# Patient Record
Sex: Female | Born: 2000 | Race: Black or African American | Hispanic: No | Marital: Single | Smoking: Current every day smoker
Health system: Southern US, Community
[De-identification: ages and names within clinical notes are randomized; demographics above are authoritative.]

## PROBLEM LIST (undated history)

## (undated) DIAGNOSIS — J45909 Unspecified asthma, uncomplicated: Secondary | ICD-10-CM

## (undated) HISTORY — PX: NO PAST SURGERIES: SHX2092

---
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
## 2021-06-24 ENCOUNTER — Encounter: Payer: Self-pay | Admitting: Emergency Medicine

## 2021-06-24 ENCOUNTER — Other Ambulatory Visit: Payer: Self-pay

## 2021-06-24 ENCOUNTER — Emergency Department
Admission: EM | Admit: 2021-06-24 | Discharge: 2021-06-24 | Disposition: A | Discharge status: Home / Self Care | Payer: Medicaid Other | Attending: Emergency Medicine | Admitting: Emergency Medicine

## 2021-06-24 DIAGNOSIS — J45909 Unspecified asthma, uncomplicated: Secondary | ICD-10-CM | POA: Insufficient documentation

## 2021-06-24 DIAGNOSIS — R300 Dysuria: Secondary | ICD-10-CM | POA: Diagnosis present

## 2021-06-24 DIAGNOSIS — N3001 Acute cystitis with hematuria: Secondary | ICD-10-CM | POA: Insufficient documentation

## 2021-06-24 LAB — WET PREP, GENITAL
Clue Cells Wet Prep HPF POC: NONE SEEN
Sperm: NONE SEEN
Trich, Wet Prep: NONE SEEN
Yeast Wet Prep HPF POC: NONE SEEN

## 2021-06-24 LAB — URINALYSIS, COMPLETE (UACMP) WITH MICROSCOPIC
Bacteria, UA: NONE SEEN
Bilirubin Urine: NEGATIVE
Glucose, UA: NEGATIVE mg/dL
Ketones, ur: NEGATIVE mg/dL
Nitrite: NEGATIVE
Protein, ur: 100 mg/dL — AB
RBC / HPF: >50 RBC/hpf — ABNORMAL HIGH (ref 0–5)
Specific Gravity, Urine: 1.021 (ref 1.005–1.030)
WBC, UA: >50 WBC/hpf — ABNORMAL HIGH (ref 0–5)
pH: 6 (ref 5.0–8.0)

## 2021-06-24 LAB — CHLAMYDIA/NGC RT PCR (ARMC ONLY)
Chlamydia Tr: DETECTED — AB
N gonorrhoeae: NOT DETECTED

## 2021-06-24 LAB — POC URINE PREG, ED: Preg Test, Ur: NEGATIVE

## 2021-06-24 MED ORDER — DOXYCYCLINE MONOHYDRATE 100 MG PO TABS: 100.0000 mg | ORAL_TABLET | Freq: Two times a day (BID) | ORAL | 0 refills | Status: DC

## 2021-06-24 MED ORDER — DOXYCYCLINE MONOHYDRATE 100 MG PO TABS: 100.0000 mg | ORAL_TABLET | Freq: Two times a day (BID) | ORAL | 0 refills | Status: AC

## 2021-06-24 MED ORDER — CEFTRIAXONE SODIUM 1 G IJ SOLR
1.0000 g | Freq: Once | INTRAMUSCULAR | Status: AC
  Administered 2021-06-24: 1 g via INTRAMUSCULAR
  Filled 2021-06-24: qty 10

## 2021-06-24 MED ORDER — METRONIDAZOLE 500 MG PO TABS: 500.0000 mg | ORAL_TABLET | Freq: Two times a day (BID) | ORAL | 0 refills | Status: AC

## 2021-06-24 MED ORDER — CEPHALEXIN 500 MG PO CAPS: 500.0000 mg | ORAL_CAPSULE | Freq: Four times a day (QID) | ORAL | 0 refills | Status: AC

## 2021-06-24 NOTE — Discharge Instructions (Addendum)
Please take Keflex 4 times daily for the next 7 days.

## 2021-06-24 NOTE — ED Provider Notes (Signed)
[facility]-EMERGENCY DEPARTMENT  ____________________________________________  Time seen: Approximately 6:26 PM  I have reviewed the triage vital signs and the nursing notes.   HISTORY  Chief Complaint Dysuria   Historian Patient     HPI Erica Finley is a 20 y.o. female presents to the emergency department with dysuria, suprapubic pain and low back pain for 1 week.  Patient denies fever and chills at home but does state that symptoms seem to be progressively worsening.  Patient has had unprotected sex and reports that STDs are a possibility.  No vaginal itching or changes in vaginal odor.  No dyspareunia.  No similar symptoms in the past.   Past Medical History:  Diagnosis Date   Asthma      Immunizations up to date:  Yes.     Past Medical History:  Diagnosis Date   Asthma     Patient Active Problem List   Diagnosis Date Noted   Overweight BMI=37.6 01/25/2020   Obesity, 233 lbs 08/30/2018    History reviewed. No pertinent surgical history.  Prior to Admission medications   Medication Sig Start Date End Date Taking? Authorizing Provider  cephALEXin (KEFLEX) 500 MG capsule Take 1 capsule (500 mg total) by mouth 4 (four) times daily for 7 days. 06/24/21 07/01/21 Yes Pia Mau M, PA-C  triamcinolone cream (KENALOG) 0.1 % Apply 1 application topically 2 (two) times daily. Patient not taking: Reported on 07/27/2019 06/15/16   Tommi Rumps, PA-C    Allergies Penicillins  Family History  Problem Relation Age of Onset   Hypertension Father    Diabetes Father    Heart failure Father    Ovarian cancer Mother    Ovarian cancer Maternal Grandmother    Sickle cell trait Half-Brother     Social History Social History   Tobacco Use   Smoking status: Never   Smokeless tobacco: Never  Substance Use Topics   Alcohol use: No   Drug use: No     Review of Systems  Constitutional: No fever/chills Eyes:  No discharge ENT: No upper respiratory  complaints. Respiratory: no cough. No SOB/ use of accessory muscles to breath Gastrointestinal:   No nausea, no vomiting.  No diarrhea.  No constipation. Genitourinary: Patient has dysuria.  Musculoskeletal: Negative for musculoskeletal pain. Skin: Negative for rash, abrasions, lacerations, ecchymosis.  ____________________________________________   PHYSICAL EXAM:  VITAL SIGNS: ED Triage Vitals  Enc Vitals Group     BP 06/24/21 1730 132/90     Pulse Rate 06/24/21 1730 75     Resp 06/24/21 1730 16     Temp 06/24/21 1730 98.9 F (37.2 C)     Temp Source 06/24/21 1730 Oral     SpO2 06/24/21 1730 99 %     Weight 06/24/21 1731 225 lb 15.5 oz (102.5 kg)     Height 06/24/21 1731 5\' 5"  (1.651 m)     Head Circumference --      Peak Flow --      Pain Score 06/24/21 1731 0     Pain Loc --      Pain Edu? --      Excl. in GC? --      Constitutional: Alert and oriented. Well appearing and in no acute distress. Eyes: Conjunctivae are normal. PERRL. EOMI. Head: Atraumatic. ENT:      Nose: No congestion/rhinnorhea.      Mouth/Throat: Mucous membranes are moist.  Neck: No stridor.  No cervical spine tenderness to palpation. Cardiovascular: Normal rate, regular rhythm.  Normal S1 and S2.  Good peripheral circulation. Respiratory: Normal respiratory effort without tachypnea or retractions. Lungs CTAB. Good air entry to the bases with no decreased or absent breath sounds Gastrointestinal: Bowel sounds x 4 quadrants. Soft and nontender to palpation. No guarding or rigidity. No distention. Genitourinary:  Musculoskeletal: Full range of motion to all extremities. No obvious deformities noted Neurologic:  Normal for age. No gross focal neurologic deficits are appreciated.  Skin:  Skin is warm, dry and intact. No rash noted. Psychiatric: Mood and affect are normal for age. Speech and behavior are normal.   ____________________________________________   LABS (all labs ordered are listed, but  only abnormal results are displayed)  Labs Reviewed  WET PREP, GENITAL - Abnormal; Notable for the following components:      Result Value   WBC, Wet Prep HPF POC RARE (*)    All other components within normal limits  URINALYSIS, COMPLETE (UACMP) WITH MICROSCOPIC - Abnormal; Notable for the following components:   Color, Urine YELLOW (*)    APPearance CLOUDY (*)    Hgb urine dipstick LARGE (*)    Protein, ur 100 (*)    Leukocytes,Ua MODERATE (*)    RBC / HPF >50 (*)    WBC, UA >50 (*)    All other components within normal limits  CHLAMYDIA/NGC RT PCR ([facility] ONLY)            URINE CULTURE  POC URINE PREG, ED   ____________________________________________  EKG   ____________________________________________  RADIOLOGY Geraldo Pitter, personally viewed and evaluated these images (plain radiographs) as part of my medical decision making, as well as reviewing the written report by the radiologist.    No results found.  ____________________________________________    PROCEDURES  Procedure(s) performed:     Procedures     Medications  cefTRIAXone (ROCEPHIN) injection 1 g (has no administration in time range)     ____________________________________________   INITIAL IMPRESSION / ASSESSMENT AND PLAN / ED COURSE  Pertinent labs & imaging results that were available during my care of the patient were reviewed by me and considered in my medical decision making (see chart for details).      Assessment and Plan:  Urinary Tract Infection: Chlamydia 20 year old female presents to the emergency department with dysuria, low back pain and suprapubic pain for 1 week.  No nausea, vomiting or abdominal pain.  Vital signs are reassuring at triage.  On physical exam, patient was alert, active and nontoxic-appearing.  Abdomen was soft and nontender without guarding.  GU exam indicated no cervical motion tenderness.  Urinalysis was concerning for UTI with large amount  hemoglobin and greater than 50 white blood cells.  Urine culture is in process.  Wet prep noncontributory pretrip, BV or yeast.  Will give IM Rocephin and discharged with Keflex. If Gonorrhea and Chlamydia testing returns positive, will treat patient with doxycycline.  Chlamydia testing was positive.  Will cover patient for PID with doxycycline twice daily for 14 days and Flagyl twice daily for 14 days.  Patient received IM Rocephin which will cover patient for gonorrhea and pyelonephritis.  Advised patient to continue Keflex 4 times daily for the next 7 days.    ____________________________________________  FINAL CLINICAL IMPRESSION(S) / ED DIAGNOSES  Final diagnoses:  Acute cystitis with hematuria      NEW MEDICATIONS STARTED DURING THIS VISIT:  ED Discharge Orders          Ordered    cephALEXin (KEFLEX) 500 MG capsule  4 times daily        06/24/21 1933                This chart was dictated using voice recognition software/Dragon. Despite best efforts to proofread, errors can occur which can change the meaning. Any change was purely unintentional.     Orvil Feil, PA-C 06/24/21 2101    Delton [place], MD 06/25/21 [phone]

## 2021-06-24 NOTE — ED Notes (Signed)
See triage note  Presents with some lower abd discomfort and pain with urination  No fever

## 2021-06-24 NOTE — ED Triage Notes (Signed)
C/O dysuria, urgency, frequency, burning.  Pelvic cramping after void.  Symptoms x 1 week.

## 2021-06-27 LAB — URINE CULTURE: Culture: 10000 — AB

## 2021-08-12 ENCOUNTER — Emergency Department
Admission: EM | Admit: 2021-08-12 | Discharge: 2021-08-12 | Disposition: A | Discharge status: Home / Self Care | Payer: Medicaid Other | Attending: Student in an Organized Health Care Education/Training Program | Admitting: Student in an Organized Health Care Education/Training Program

## 2021-08-12 ENCOUNTER — Emergency Department: Payer: Medicaid Other

## 2021-08-12 ENCOUNTER — Other Ambulatory Visit: Payer: Self-pay

## 2021-08-12 DIAGNOSIS — R102 Pelvic and perineal pain: Secondary | ICD-10-CM | POA: Diagnosis not present

## 2021-08-12 DIAGNOSIS — R1030 Lower abdominal pain, unspecified: Secondary | ICD-10-CM

## 2021-08-12 DIAGNOSIS — R1032 Left lower quadrant pain: Secondary | ICD-10-CM | POA: Insufficient documentation

## 2021-08-12 DIAGNOSIS — R1031 Right lower quadrant pain: Secondary | ICD-10-CM | POA: Diagnosis present

## 2021-08-12 DIAGNOSIS — M545 Low back pain, unspecified: Secondary | ICD-10-CM | POA: Insufficient documentation

## 2021-08-12 DIAGNOSIS — J45909 Unspecified asthma, uncomplicated: Secondary | ICD-10-CM | POA: Insufficient documentation

## 2021-08-12 LAB — URINALYSIS, COMPLETE (UACMP) WITH MICROSCOPIC
Bacteria, UA: NONE SEEN
Glucose, UA: NEGATIVE mg/dL
Hgb urine dipstick: NEGATIVE
Leukocytes,Ua: NEGATIVE
Nitrite: NEGATIVE
Protein, ur: 300 mg/dL — AB
Specific Gravity, Urine: 1.03 (ref 1.005–1.030)
pH: 6 (ref 5.0–8.0)

## 2021-08-12 LAB — WET PREP, GENITAL
Sperm: NONE SEEN
Trich, Wet Prep: NONE SEEN
Yeast Wet Prep HPF POC: NONE SEEN

## 2021-08-12 LAB — CHLAMYDIA/NGC RT PCR (ARMC ONLY)
Chlamydia Tr: NOT DETECTED
Chlamydia Tr: NOT DETECTED
N gonorrhoeae: NOT DETECTED
N gonorrhoeae: NOT DETECTED

## 2021-08-12 LAB — COMPREHENSIVE METABOLIC PANEL
ALT: 14 U/L (ref 0–44)
AST: 15 U/L (ref 15–41)
Albumin: 3.8 g/dL (ref 3.5–5.0)
Alkaline Phosphatase: 56 U/L (ref 38–126)
Anion gap: 6 (ref 5–15)
BUN: 9 mg/dL (ref 6–20)
CO2: 24 mmol/L (ref 22–32)
Calcium: 8.8 mg/dL — ABNORMAL LOW (ref 8.9–10.3)
Chloride: 107 mmol/L (ref 98–111)
Creatinine, Ser: 0.76 mg/dL (ref 0.44–1.00)
GFR, Estimated: >60 mL/min (ref >60)
Glucose, Bld: 108 mg/dL — ABNORMAL HIGH (ref 70–99)
Potassium: 3.3 mmol/L — ABNORMAL LOW (ref 3.5–5.1)
Sodium: 137 mmol/L (ref 135–145)
Total Bilirubin: 0.7 mg/dL (ref 0.3–1.2)
Total Protein: 7 g/dL (ref 6.5–8.1)

## 2021-08-12 LAB — CBC
HCT: 37.7 % (ref 36.0–46.0)
Hemoglobin: 12.8 g/dL (ref 12.0–15.0)
MCH: 27.7 pg (ref 26.0–34.0)
MCHC: 34 g/dL (ref 30.0–36.0)
MCV: 81.6 fL (ref 80.0–100.0)
Platelets: 225 10*3/uL (ref 150–400)
RBC: 4.62 MIL/uL (ref 3.87–5.11)
RDW: 12.6 % (ref 11.5–15.5)
WBC: 5.4 10*3/uL (ref 4.0–10.5)
nRBC: 0 % (ref 0.0–0.2)

## 2021-08-12 LAB — HCG, QUANTITATIVE, PREGNANCY: hCG, Beta Chain, Quant, S: <1 m[IU]/mL (ref <5)

## 2021-08-12 LAB — LIPASE, BLOOD: Lipase: 29 U/L (ref 11–51)

## 2021-08-12 MED ORDER — METRONIDAZOLE 500 MG PO TABS: 500.0000 mg | ORAL_TABLET | Freq: Two times a day (BID) | ORAL | 0 refills | Status: DC

## 2021-08-12 MED ORDER — METRONIDAZOLE 500 MG PO TABS
500.0000 mg | ORAL_TABLET | Freq: Once | ORAL | Status: AC
  Administered 2021-08-12: 500 mg via ORAL
  Filled 2021-08-12: qty 1

## 2021-08-12 MED ORDER — DOXYCYCLINE HYCLATE 100 MG PO TABS
100.0000 mg | ORAL_TABLET | Freq: Once | ORAL | Status: DC
Start: 2021-08-12 — End: 2021-08-12

## 2021-08-12 MED ORDER — ONDANSETRON 4 MG PO TBDP
4.0000 mg | ORAL_TABLET | Freq: Three times a day (TID) | ORAL | 0 refills | Status: DC | PRN
Start: 2021-08-12 — End: 2022-01-29

## 2021-08-12 MED ORDER — METRONIDAZOLE 500 MG PO TABS: 500.0000 mg | ORAL_TABLET | Freq: Two times a day (BID) | ORAL | 0 refills | Status: AC

## 2021-08-12 MED ORDER — DOXYCYCLINE HYCLATE 100 MG PO TABS
100.0000 mg | ORAL_TABLET | Freq: Once | ORAL | Status: AC
  Administered 2021-08-12: 100 mg via ORAL
  Filled 2021-08-12: qty 1

## 2021-08-12 MED ORDER — DOXYCYCLINE HYCLATE 100 MG PO TABS: 100.0000 mg | ORAL_TABLET | Freq: Two times a day (BID) | ORAL | 0 refills | Status: AC

## 2021-08-12 MED ORDER — CEFTRIAXONE SODIUM 1 G IJ SOLR
500.0000 mg | Freq: Once | INTRAMUSCULAR | Status: AC
  Administered 2021-08-12: 500 mg via INTRAMUSCULAR
  Filled 2021-08-12: qty 10

## 2021-08-12 NOTE — Discharge Instructions (Addendum)

## 2021-08-12 NOTE — ED Triage Notes (Signed)
Pt to ED for lower abd pain that started yesterday. Denies n/v/d States had a UTI a month ago and unsure if this is cause.  NAD noted

## 2021-08-12 NOTE — ED Provider Notes (Signed)
[facility] Emergency Department Provider Note    Event Date/Time   First MD Initiated Contact with Patient 08/12/21 1528     (approximate)  I have reviewed the triage vital signs and the nursing notes.   HISTORY  Chief Complaint Abdominal Pain    HPI Erica Finley is a 20 y.o. female presents to the ER for evaluation of bilateral lower abdominal pain that started yesterday.  Denies any nausea vomiting or diarrhea.  Is never had pain like this before.  No fevers.  Does have some low back pain.  Thinks it may be urinary tract infection but feels different than when she was treated for previously.  Denies any vaginal discharge.  Past Medical History:  Diagnosis Date   Asthma    Family History  Problem Relation Age of Onset   Hypertension Father    Diabetes Father    Heart failure Father    Ovarian cancer Mother    Ovarian cancer Maternal Grandmother    Sickle cell trait Half-Brother    No past surgical history on file. Patient Active Problem List   Diagnosis Date Noted   Overweight BMI=37.6 01/25/2020   Obesity, 233 lbs 08/30/2018      Prior to Admission medications   Medication Sig Start Date End Date Taking? Authorizing Provider  doxycycline (VIBRA-TABS) 100 MG tablet Take 1 tablet (100 mg total) by mouth 2 (two) times daily for 14 days. 08/12/21 08/26/21 Yes Willy Eddy, MD  metroNIDAZOLE (FLAGYL) 500 MG tablet Take 1 tablet (500 mg total) by mouth 2 (two) times daily for 14 days. 08/12/21 08/26/21 Yes Willy Eddy, MD  ondansetron (ZOFRAN ODT) 4 MG disintegrating tablet Take 1 tablet (4 mg total) by mouth every 8 (eight) hours as needed for nausea or vomiting. 08/12/21  Yes Willy Eddy, MD  triamcinolone cream (KENALOG) 0.1 % Apply 1 application topically 2 (two) times daily. Patient not taking: Reported on 07/27/2019 06/15/16   Tommi Rumps, PA-C    Allergies Penicillins    Social History Social History   Tobacco  Use   Smoking status: Never   Smokeless tobacco: Never  Substance Use Topics   Alcohol use: No   Drug use: No    Review of Systems Patient denies headaches, rhinorrhea, blurry vision, numbness, shortness of breath, chest pain, edema, cough, abdominal pain, nausea, vomiting, diarrhea, dysuria, fevers, rashes or hallucinations unless otherwise stated above in HPI. ____________________________________________   PHYSICAL EXAM:  VITAL SIGNS: Vitals:   08/12/21 1449 08/12/21 1540  BP: 140/76 115/65  Pulse: 82 85  Resp: 16 16  Temp: 98.8 F (37.1 C)   SpO2: 100% 100%    Constitutional: Alert and oriented.  Eyes: Conjunctivae are normal.  Head: Atraumatic. Nose: No congestion/rhinnorhea. Mouth/Throat: Mucous membranes are moist.   Neck: No stridor. Painless ROM.  Cardiovascular: Normal rate, regular rhythm. Grossly normal heart sounds.  Good peripheral circulation. Respiratory: Normal respiratory effort.  No retractions. Lungs CTAB. Gastrointestinal: Soft with mild suprapubic ttp, no guarding or rebound,  No distention. No abdominal bruits. No CVA tenderness. Genitourinary:  Musculoskeletal: No lower extremity tenderness nor edema.  No joint effusions. Neurologic:  Normal speech and language. No gross focal neurologic deficits are appreciated. No facial droop Skin:  Skin is warm, dry and intact. No rash noted. Psychiatric: Mood and affect are normal. Speech and behavior are normal.  ____________________________________________   LABS (all labs ordered are listed, but only abnormal results are displayed)  Results for orders placed  or performed during the hospital encounter of 08/12/21 (from the past 24 hour(s))  Urinalysis, Complete w Microscopic     Status: Abnormal   Collection Time: 08/12/21  2:51 PM  Result Value Ref Range   Color, Urine YELLOW (A) YELLOW   APPearance CLOUDY (A) CLEAR   Specific Gravity, Urine 1.030 1.005 - 1.030   pH 6.0 5.0 - 8.0   Glucose, UA  NEGATIVE NEGATIVE mg/dL   Hgb urine dipstick NEGATIVE NEGATIVE   Bilirubin Urine TRACE (A) NEGATIVE   Ketones, ur TRACE (A) NEGATIVE mg/dL   Protein, ur 093 (A) NEGATIVE mg/dL   Nitrite NEGATIVE NEGATIVE   Leukocytes,Ua NEGATIVE NEGATIVE   RBC / HPF 0-5 0 - 5 RBC/hpf   WBC, UA 6-10 0 - 5 WBC/hpf   Bacteria, UA NONE SEEN NONE SEEN   Squamous Epithelial / LPF 21-50 0 - 5   Mucus PRESENT   Lipase, blood     Status: None   Collection Time: 08/12/21  2:52 PM  Result Value Ref Range   Lipase 29 11 - 51 U/L  Comprehensive metabolic panel     Status: Abnormal   Collection Time: 08/12/21  2:52 PM  Result Value Ref Range   Sodium 137 135 - 145 mmol/L   Potassium 3.3 (L) 3.5 - 5.1 mmol/L   Chloride 107 98 - 111 mmol/L   CO2 24 22 - 32 mmol/L   Glucose, Bld 108 (H) 70 - 99 mg/dL   BUN 9 6 - 20 mg/dL   Creatinine, Ser 8.18 0.44 - 1.00 mg/dL   Calcium 8.8 (L) 8.9 - 10.3 mg/dL   Total Protein 7.0 6.5 - 8.1 g/dL   Albumin 3.8 3.5 - 5.0 g/dL   AST 15 15 - 41 U/L   ALT 14 0 - 44 U/L   Alkaline Phosphatase 56 38 - 126 U/L   Total Bilirubin 0.7 0.3 - 1.2 mg/dL   GFR, Estimated >29 >93 mL/min   Anion gap 6 5 - 15  CBC     Status: None   Collection Time: 08/12/21  2:52 PM  Result Value Ref Range   WBC 5.4 4.0 - 10.5 K/uL   RBC 4.62 3.87 - 5.11 MIL/uL   Hemoglobin 12.8 12.0 - 15.0 g/dL   HCT 71.6 96.7 - 89.3 %   MCV 81.6 80.0 - 100.0 fL   MCH 27.7 26.0 - 34.0 pg   MCHC 34.0 30.0 - 36.0 g/dL   RDW 81.0 17.5 - 10.2 %   Platelets 225 150 - 400 K/uL   nRBC 0.0 0.0 - 0.2 %  hCG, quantitative, pregnancy     Status: None   Collection Time: 08/12/21  2:52 PM  Result Value Ref Range   hCG, Beta Chain, Quant, S <1 <5 mIU/mL  Chlamydia/NGC rt PCR ([facility] only)     Status: None   Collection Time: 08/12/21  4:03 PM   Specimen: Cervical/Vaginal swab  Result Value Ref Range   Specimen source GC/Chlam ENDOCERVICAL    Chlamydia Tr NOT DETECTED NOT DETECTED   N gonorrhoeae NOT DETECTED NOT DETECTED    ____________________________________________ ____________________________________________  RADIOLOGY  I personally reviewed all radiographic images ordered to evaluate for the above acute complaints and reviewed radiology reports and findings.  These findings were personally discussed with the patient.  Please see medical record for radiology report.  ____________________________________________   PROCEDURES  Procedure(s) performed:  Procedures    Critical Care performed: no ____________________________________________   INITIAL IMPRESSION / ASSESSMENT  AND PLAN / ED COURSE  Pertinent labs & imaging results that were available during my care of the patient were reviewed by me and considered in my medical decision making (see chart for details).   DDX: pid, toa, cystitis, torsion, mass, cyst, appendicitis  PETINA MURASKI is a 20 y.o. who presents to the ED with presentation as described above.  Patient nontoxic-appearing and is describing bilateral lower abdominal pain in the pelvic region.  She denies any discharge or bleeding.  Denies any chance of being pregnant.  She is afebrile no white count.  Will check urine.  Will order ultrasound to evaluate for the but differential.  Have a lower suspicion for appendicitis  Clinical Course as of 08/12/21 1835  Mon Aug 12, 2021  1191 Pelvic exam with cervical erythema and irritation with purulent discharge and cervical motion tenderness.  Will treat for PID.  GC CBC was negative we will send off wet prep.  No history of STI as the initial swab was a self swab GC CBC will be repeated and we will treat empirically for PID based on her exam she is well and nontoxic-appearing.  This does not seem consistent with appendicitis given lack of white count and her presentation is better explained by pelvic pathology.  Patient tolerating p.o.  Antibiotics sent to pharmacy.  Demonstrates understanding of plan and agrees to follow-up. [PR]     Clinical Course User Index [PR] Willy Eddy, MD    The patient was evaluated in Emergency Department today for the symptoms described in the history of present illness. He/she was evaluated in the context of the global COVID-19 pandemic, which necessitated consideration that the patient might be at risk for infection with the SARS-CoV-2 virus that causes COVID-19. Institutional protocols and algorithms that pertain to the evaluation of patients at risk for COVID-19 are in a state of rapid change based on information released by regulatory bodies including the CDC and federal and state organizations. These policies and algorithms were followed during the patient's care in the ED.  As part of my medical decision making, I reviewed the following data within the electronic MEDICAL RECORD NUMBER Nursing notes reviewed and incorporated, Labs reviewed, notes from prior ED visits and [place] Controlled Substance Database   ____________________________________________   FINAL CLINICAL IMPRESSION(S) / ED DIAGNOSES  Final diagnoses:  Lower abdominal pain  Pelvic pain      NEW MEDICATIONS STARTED DURING THIS VISIT:  New Prescriptions   DOXYCYCLINE (VIBRA-TABS) 100 MG TABLET    Take 1 tablet (100 mg total) by mouth 2 (two) times daily for 14 days.   METRONIDAZOLE (FLAGYL) 500 MG TABLET    Take 1 tablet (500 mg total) by mouth 2 (two) times daily for 14 days.   ONDANSETRON (ZOFRAN ODT) 4 MG DISINTEGRATING TABLET    Take 1 tablet (4 mg total) by mouth every 8 (eight) hours as needed for nausea or vomiting.     Note:  This document was prepared using Dragon voice recognition software and may include unintentional dictation errors.    Willy Eddy, MD 08/12/21 [phone]

## 2021-12-04 ENCOUNTER — Emergency Department: Payer: Medicaid Other

## 2021-12-04 ENCOUNTER — Emergency Department
Admission: EM | Admit: 2021-12-04 | Discharge: 2021-12-04 | Disposition: A | Discharge status: Home / Self Care | Payer: Medicaid Other | Attending: Emergency Medicine | Admitting: Emergency Medicine

## 2021-12-04 ENCOUNTER — Other Ambulatory Visit: Payer: Self-pay

## 2021-12-04 ENCOUNTER — Encounter: Payer: Self-pay | Admitting: Emergency Medicine

## 2021-12-04 DIAGNOSIS — J45909 Unspecified asthma, uncomplicated: Secondary | ICD-10-CM | POA: Diagnosis not present

## 2021-12-04 DIAGNOSIS — S62306A Unspecified fracture of fifth metacarpal bone, right hand, initial encounter for closed fracture: Secondary | ICD-10-CM | POA: Insufficient documentation

## 2021-12-04 DIAGNOSIS — S62339A Displaced fracture of neck of unspecified metacarpal bone, initial encounter for closed fracture: Secondary | ICD-10-CM

## 2021-12-04 DIAGNOSIS — S6991XA Unspecified injury of right wrist, hand and finger(s), initial encounter: Secondary | ICD-10-CM | POA: Diagnosis present

## 2021-12-04 DIAGNOSIS — W2201XA Walked into wall, initial encounter: Secondary | ICD-10-CM | POA: Insufficient documentation

## 2021-12-04 NOTE — ED Triage Notes (Signed)
Punched a wall today at lunch, c/o right hand pain.

## 2021-12-04 NOTE — ED Provider Notes (Signed)
[facility] Emergency Department Provider Note ____________________________________________  Time seen: Approximately 8:11 PM  I have reviewed the triage vital signs and the nursing notes.   HISTORY  Chief Complaint Hand Injury    HPI Erica Finley is a 20 y.o. female who presents to the emergency department for evaluation and treatment of right hand pain. She got angry and punched a wall earlier today. Right hand dominant.  Past Medical History:  Diagnosis Date   Asthma     Patient Active Problem List   Diagnosis Date Noted   Overweight BMI=37.6 01/25/2020   Obesity, 233 lbs 08/30/2018    History reviewed. No pertinent surgical history.  Prior to Admission medications   Medication Sig Start Date End Date Taking? Authorizing Provider  ondansetron (ZOFRAN ODT) 4 MG disintegrating tablet Take 1 tablet (4 mg total) by mouth every 8 (eight) hours as needed for nausea or vomiting. 08/12/21   Willy Eddy, MD  triamcinolone cream (KENALOG) 0.1 % Apply 1 application topically 2 (two) times daily. Patient not taking: Reported on 07/27/2019 06/15/16   Tommi Rumps, PA-C    Allergies Penicillins  Family History  Problem Relation Age of Onset   Hypertension Father    Diabetes Father    Heart failure Father    Ovarian cancer Mother    Ovarian cancer Maternal Grandmother    Sickle cell trait Half-Brother     Social History Social History   Tobacco Use   Smoking status: Never   Smokeless tobacco: Never  Substance Use Topics   Alcohol use: No   Drug use: No    Review of Systems Constitutional: Negative for fever. Cardiovascular: Negative for chest pain. Respiratory: Negative for shortness of breath. Musculoskeletal: Positive for right hand pain Skin: Negative for open wound over right hand.  Neurological: Negative for decrease in sensation  ____________________________________________   PHYSICAL EXAM:  VITAL SIGNS: ED  Triage Vitals  Enc Vitals Group     BP 12/04/21 1858 (!) 141/72     Pulse Rate 12/04/21 1858 86     Resp 12/04/21 1858 16     Temp 12/04/21 1858 98.5 F (36.9 C)     Temp Source 12/04/21 1858 Oral     SpO2 12/04/21 1858 100 %     Weight 12/04/21 1841 149 lb 14.6 oz (68 kg)     Height 12/04/21 1841 5\' 6"  (1.676 m)     Head Circumference --      Peak Flow --      Pain Score 12/04/21 1841 7     Pain Loc --      Pain Edu? --      Excl. in GC? --     Constitutional: Alert and oriented. Well appearing and in no acute distress. Eyes: Conjunctivae are clear without discharge or drainage Head: Atraumatic Neck: Supple Respiratory: No cough. Respirations are even and unlabored. Musculoskeletal: Swelling over the 5th metacarpal. Able to perform ROM of pinky finger.  Neurologic: Motor and sensory function intact.  Skin: No open wound of right hand.  Psychiatric: Affect and behavior are appropriate.  ____________________________________________   LABS (all labs ordered are listed, but only abnormal results are displayed)  Labs Reviewed - No data to display ____________________________________________  RADIOLOGY  5th metacarpal fracture of right hand.  I, 12/06/21, personally viewed and evaluated these images (plain radiographs) as part of my medical decision making, as well as reviewing the written report by the radiologist.  DG Hand Complete  Right  Result Date: 12/04/2021 CLINICAL DATA:  Hand pain after punching a wall EXAM: RIGHT HAND - COMPLETE 3+ VIEW COMPARISON:  None. FINDINGS: There is a minimally displaced fracture of the distal shaft of the right fifth metacarpal. IMPRESSION: Minimally displaced fracture of the distal shaft of the right fifth metacarpal. Electronically Signed   By: Deatra Robinson M.D.   On: 12/04/2021 19:18   ____________________________________________   PROCEDURES  .Ortho Injury Treatment  Date/Time: 12/04/2021 8:15 PM Performed by: Chinita Pester, FNP Authorized by: Chinita Pester, FNP   Consent:    Consent obtained:  Verbal   Consent given by:  PatientInjury location: hand Location details: right hand Injury type: fracture Fracture type: fifth metacarpal Pre-procedure neurovascular assessment: neurovascularly intact Pre-procedure distal perfusion: normal Pre-procedure neurological function: normal Pre-procedure range of motion: normal Manipulation performed: no Immobilization: splint Splint type: ulnar gutter Splint Applied by: ED Tech Supplies used: cotton padding, elastic bandage and Ortho-Glass Post-procedure neurovascular assessment: post-procedure neurovascularly intact Post-procedure distal perfusion: normal Post-procedure neurological function: normal Post-procedure range of motion: normal    ____________________________________________   INITIAL IMPRESSION / ASSESSMENT AND PLAN / ED COURSE  Erica Finley is a 20 y.o. who presents to the emergency department for evaluation after punching a wall. See HPI.  Image confirms fracture of 5th metacarpal. She was placed in OCL. See above.  Patient instructed to follow-up with orthopedics.  She was also instructed to return to the emergency department for symptoms that change or worsen if unable schedule an appointment with orthopedics or primary care.  Medications - No data to display  Pertinent labs & imaging results that were available during my care of the patient were reviewed by me and considered in my medical decision making (see chart for details).   _________________________________________   FINAL CLINICAL IMPRESSION(S) / ED DIAGNOSES  Final diagnoses:  Closed boxer's fracture, initial encounter    Initial fracture care was provided. Follow up will be greater than 24 hours.  ED Discharge Orders     None        If controlled substance prescribed during this visit, 12 month history viewed on the [facility] prior to issuing an  initial prescription for Schedule II or III opiod.    Chinita Pester, FNP 12/04/21 2016    Merwyn Katos, MD 12/04/21 2118

## 2021-12-04 NOTE — Discharge Instructions (Signed)
Take Tylenol, Ibuprofen, or Aleve for pain. Do not remove the splint. Call orthopedics tomorrow for follow up appointment. Return to the ER for symptoms that change, worsen, or for new concerns.

## 2022-01-29 ENCOUNTER — Ambulatory Visit (LOCAL_COMMUNITY_HEALTH_CENTER): Payer: Medicaid Other | Admitting: Family Medicine

## 2022-01-29 ENCOUNTER — Ambulatory Visit: Payer: Medicaid Other

## 2022-01-29 ENCOUNTER — Other Ambulatory Visit: Payer: Self-pay

## 2022-01-29 ENCOUNTER — Encounter (LOCAL_COMMUNITY_HEALTH_CENTER): Payer: Medicaid Other | Admitting: Family Medicine

## 2022-01-29 VITALS — BP 111/66 | HR 73 | Temp 97.1°F | Resp 16 | Ht 66.5 in | Wt 165.0 lb

## 2022-01-29 DIAGNOSIS — Z30011 Encounter for initial prescription of contraceptive pills: Secondary | ICD-10-CM | POA: Diagnosis not present

## 2022-01-29 DIAGNOSIS — Z309 Encounter for contraceptive management, unspecified: Secondary | ICD-10-CM

## 2022-01-29 DIAGNOSIS — Z3046 Encounter for surveillance of implantable subdermal contraceptive: Secondary | ICD-10-CM

## 2022-01-29 DIAGNOSIS — Z113 Encounter for screening for infections with a predominantly sexual mode of transmission: Secondary | ICD-10-CM

## 2022-01-29 DIAGNOSIS — Z0000 Encounter for general adult medical examination without abnormal findings: Secondary | ICD-10-CM

## 2022-01-29 LAB — HM HIV SCREENING LAB: HM HIV Screening: NEGATIVE

## 2022-01-29 MED ORDER — NORGESTIM-ETH ESTRAD TRIPHASIC 0.18/0.215/0.25 MG-35 MCG PO TABS: 1.0000 | ORAL_TABLET | Freq: Every day | ORAL | 2 refills | Status: DC

## 2022-01-29 NOTE — Progress Notes (Signed)
[facility] [address] Main Number: [phone]  Family Planning Visit- Repeat Yearly Visit  Subjective:  Erica Finley is a 21 y.o. G0P0000  being seen today for an annual wellness visit and to discuss contraception options.   The patient is currently using Hormonal Implant for pregnancy prevention. Patient does not want a pregnancy in the next year. Patient has the following medical problems: has Obesity, 233 lbs and Overweight BMI=37.6 on their problem list.  Chief Complaint  Patient presents with   Annual Exam    Patient reports here for physical and concerns about nexplanon   Patient denies any other concerns    See flowsheet for other program required questions.   Body mass index is 26.23 kg/m. - Patient is eligible for diabetes screening based on BMI and age >33?  not applicable HA1C ordered? not applicable  Patient reports 1 of partners in last year. Desires STI screening?  No - "I was recently tested in September and no new partners "    Has patient been screened once for HCV in the past?  No  No results found for: HCVAB  Does the patient have current of drug use, have a partner with drug use, and/or has been incarcerated since last result? No  If yes-- Screen for HCV through [facility]   Does the patient meet criteria for HBV testing? No  Criteria:  -Household, sexual or needle sharing contact with HBV -History of drug use -HIV positive -Those with known Hep C   Health Maintenance Due  Topic Date Due   COVID-19 Vaccine (1) Never done   HPV VACCINES (1 - 2-dose series) Never done   HIV Screening  Never done   Hepatitis C Screening  Never done   TETANUS/TDAP  Never done   INFLUENZA VACCINE  Never done    ROS  The following portions of the patient's history were reviewed and updated as appropriate: allergies, current medications, past family history, past medical history, past social  history, past surgical history and problem list. Problem list updated.  Objective:   Vitals:   01/29/22 1013  BP: 111/66  Pulse: 73  Resp: 16  Temp: (!) 97.1 F (36.2 C)  TempSrc: Oral  Weight: 165 lb (74.8 kg)  Height: 5' 6.5" (1.689 m)    Physical Exam Physical Exam Vitals and nursing note reviewed.  Constitutional:      Appearance: Normal appearance. She is obese.  HENT:     Head: Normocephalic and atraumatic.     Mouth/Throat:     Dentition: Normal dentition. No dental caries.  Neck:     Thyroid: No thyroid mass, thyromegaly or thyroid tenderness.  Cardiovascular:     Rate and Rhythm: Normal rate and regular rhythm.     Pulses: Normal pulses.     Heart sounds: Normal heart sounds.  Pulmonary:     Effort: Pulmonary effort is normal.     Breath sounds: Normal breath sounds.  Abdominal:     General: Abdomen is flat.     Palpations: Abdomen is soft.  Genitourinary:    Comments: deferred Musculoskeletal:     Cervical back: Normal range of motion and neck supple.  Lymphadenopathy:     Cervical: No cervical adenopathy.  Skin:    General: Skin is warm and dry.  Neurological:     Mental Status: She is alert and oriented to person, place, and time.  Psychiatric:  Mood and Affect: Mood normal.        Behavior: Behavior normal.      Assessment and Plan:  Erica Finley is a 21 y.o. female G0P0000 presenting to the [facility] Department for an yearly wellness and contraception visit  Contraception counseling: Reviewed all forms of birth control options in the tiered based approach. available including abstinence; over the counter/barrier methods; hormonal contraceptive medication including pill, patch, ring, injection,contraceptive implant, ECP; hormonal and nonhormonal IUDs; permanent sterilization options including vasectomy and the various tubal sterilization modalities. Risks, benefits, and typical effectiveness rates were reviewed.  Questions  were answered.  Written information was also given to the patient to review.  Patient desires to continue with nexplanon, this was prescribed for patient. She will follow up in  as needed for surveillance.    She was told to call with any further questions, or with any concerns about this method of contraception.  Emphasized use of condoms 100% of the time for STI prevention.  Patient was not offered ECP based on current birth control.      1. Routine general medical examination at a health care facility Well woman exam  Anticipated guidance - CBE @21  yrs of age, pap @ 22 yrs.   2. Encounter for surveillance of implantable subdermal contraceptive Nexplanon palpated and In place   3. Screening examination for venereal disease Pt reports that she thinks her mother is HIV positive.  Pt states that mother told her "it is something the everyone has in them like a common cold".   Pt educated on HIV and management.   - HIV [facility] - Syphilis Serology,  Lab  4. Encounter for initial prescription of contraceptive pills Pt prescribed 3 months of OCP for breakthrough bleeding.  Pt to follow up if bleeding continues.    - Norgestimate-Ethinyl Estradiol Triphasic (TRI-SPRINTEC) 0.18/0.215/0.25 MG-35 MCG tablet; Take 1 tablet by mouth daily.  Dispense: 28 tablet; Refill: 2   Return in about 1 year (around 01/29/2023) for annual well woman exam.  No future appointments.  01/31/2023, FNP

## 2022-01-30 ENCOUNTER — Emergency Department
Admission: EM | Admit: 2022-01-30 | Discharge: 2022-01-30 | Disposition: A | Discharge status: Home / Self Care | Payer: Medicaid Other | Attending: Emergency Medicine | Admitting: Emergency Medicine

## 2022-01-30 ENCOUNTER — Other Ambulatory Visit: Payer: Self-pay

## 2022-01-30 DIAGNOSIS — J029 Acute pharyngitis, unspecified: Secondary | ICD-10-CM | POA: Insufficient documentation

## 2022-01-30 LAB — GROUP A STREP BY PCR: Group A Strep by PCR: NOT DETECTED

## 2022-01-30 NOTE — Discharge Instructions (Signed)
Continue taking Tylenol and ibuprofen alternating for pharyngitis.

## 2022-01-30 NOTE — ED Triage Notes (Signed)
Pt presents to ER c/o sore throat x2-3 weeks.  Pt states pain is mostly on left side and is sharp in nature.  Pt states she has been sick recently and has got over all her symptoms but her sore throat persisted.  Pt denies fever at home.  Pt A&O x4 at this time and in NAD.

## 2022-01-31 NOTE — ED Provider Notes (Signed)
[facility] Provider Note  Patient Contact: 12:01 AM (approximate)   History   Sore Throat   HPI  Erica Finley is a 21 y.o. female presents to the emergency department with pharyngitis that is occurred intermittently for the past 3 weeks.  Patient has been afebrile with some rhinorrhea and nasal congestion.  Sick contacts in the home with similar symptoms.  Patient is managing her own secretions and speaking in complete sentences.  No chest pain or abdominal pain.      Physical Exam   Triage Vital Signs: ED Triage Vitals  Enc Vitals Group     BP 01/30/22 2019 115/77     Pulse Rate 01/30/22 2019 80     Resp 01/30/22 2019 16     Temp 01/30/22 2019 99 F (37.2 C)     Temp Source 01/30/22 2019 Oral     SpO2 01/30/22 2019 95 %     Weight 01/30/22 2022 165 lb (74.8 kg)     Height 01/30/22 2022 5\' 6"  (1.676 m)     Head Circumference --      Peak Flow --      Pain Score 01/30/22 2021 8     Pain Loc --      Pain Edu? --      Excl. in GC? --     Most recent vital signs: Vitals:   01/30/22 2019  BP: 115/77  Pulse: 80  Resp: 16  Temp: 99 F (37.2 C)  SpO2: 95%     Constitutional: Alert and oriented. Patient is lying supine. Eyes: Conjunctivae are normal. PERRL. EOMI. Head: Atraumatic. ENT:      Ears: Tympanic membranes are mildly injected with mild effusion bilaterally.       Nose: No congestion/rhinnorhea.      Mouth/Throat: Mucous membranes are moist. Posterior pharynx is mildly erythematous.  Hematological/Lymphatic/Immunilogical: No cervical lymphadenopathy.  Cardiovascular: Normal rate, regular rhythm. Normal S1 and S2.  Good peripheral circulation. Respiratory: Normal respiratory effort without tachypnea or retractions. Lungs CTAB. Good air entry to the bases with no decreased or absent breath sounds. Gastrointestinal: Bowel sounds 4 quadrants. Soft and nontender to palpation. No guarding or rigidity. No palpable masses. No  distention. No CVA tenderness. Musculoskeletal: Full range of motion to all extremities. No gross deformities appreciated. Neurologic:  Normal speech and language. No gross focal neurologic deficits are appreciated.  Skin:  Skin is warm, dry and intact. No rash noted. Psychiatric: Mood and affect are normal. Speech and behavior are normal. Patient exhibits appropriate insight and judgement.   ED Results / Procedures / Treatments   Labs (all labs ordered are listed, but only abnormal results are displayed) Labs Reviewed  GROUP A STREP BY PCR       PROCEDURES:  Critical Care performed: No  Procedures   MEDICATIONS ORDERED IN ED: Medications - No data to display   IMPRESSION / MDM / ASSESSMENT AND PLAN / ED COURSE  I reviewed the triage vital signs and the nursing notes.                              Differential diagnosis includes, but is not limited to, strep throat, COVID-19, influenza, unspecified viral infection  21 year old female presents to the emergency department with pharyngitis that is occurred intermittently for the past 3 weeks.  Vital signs were reassuring at triage.  On physical exam, patient was alert, active and nontoxic-appearing.  She was managing her own secretions and speaking in complete sentences.  Patient was group A strep negative.  Suspect viral pharyngitis at this time.  Supportive measures were encouraged at home.     FINAL CLINICAL IMPRESSION(S) / ED DIAGNOSES   Final diagnoses:  Viral pharyngitis     Rx / DC Orders   ED Discharge Orders     None        Note:  This document was prepared using Dragon voice recognition software and may include unintentional dictation errors.   Pia Mau [place], PA-C 01/31/22 0002    Arnaldo Natal, MD 01/31/22 2221

## 2022-02-03 NOTE — Progress Notes (Signed)
See other note with same date.   Kennidy Lamke, FNP  

## 2022-05-01 ENCOUNTER — Other Ambulatory Visit: Payer: Self-pay

## 2022-05-01 ENCOUNTER — Emergency Department
Admission: EM | Admit: 2022-05-01 | Discharge: 2022-05-01 | Disposition: A | Discharge status: Home / Self Care | Payer: Medicaid Other | Attending: Emergency Medicine | Admitting: Emergency Medicine

## 2022-05-01 ENCOUNTER — Emergency Department: Payer: Medicaid Other

## 2022-05-01 ENCOUNTER — Encounter: Payer: Self-pay | Admitting: Intensive Care

## 2022-05-01 DIAGNOSIS — R519 Headache, unspecified: Secondary | ICD-10-CM | POA: Insufficient documentation

## 2022-05-01 LAB — POC URINE PREG, ED: Preg Test, Ur: NEGATIVE

## 2022-05-01 MED ORDER — IBUPROFEN 600 MG PO TABS
600.0000 mg | ORAL_TABLET | Freq: Once | ORAL | Status: AC
  Administered 2022-05-01: 600 mg via ORAL
  Filled 2022-05-01: qty 1

## 2022-05-01 MED ORDER — ACETAMINOPHEN 500 MG PO TABS
1000.0000 mg | ORAL_TABLET | Freq: Once | ORAL | Status: AC
  Administered 2022-05-01: 1000 mg via ORAL
  Filled 2022-05-01: qty 2

## 2022-05-01 NOTE — ED Notes (Signed)
See triage note  presents with headache for 2-3 days  states pain is mainly frontal area and temporal area  no fever or cough  also has small raised area to back of head

## 2022-05-01 NOTE — Discharge Instructions (Addendum)
Follow up with your primary care doctor Ibuprofen and tylenol for headache

## 2022-05-01 NOTE — ED Triage Notes (Signed)
Patient c/o small knot on back of head that causes her head to hurt. Denies recent injury.

## 2022-05-01 NOTE — ED Notes (Signed)
Pt transported to CT ?

## 2022-05-01 NOTE — ED Provider Notes (Signed)
[facility] Provider Note    Event Date/Time   First MD Initiated Contact with Patient 05/01/22 0825     (approximate)   History   Headache   HPI  Erica Finley is a 21 y.o. female   presents to the ED with complaint of headache for 3 days.  Patient states that it begins on the left posterior portion of her scalp and causes her to have a generalized headache.  She denies any visual changes, nausea, vomiting, dizziness.  She has not taken any medication for her headache so far.  No previous history of headache.  No recent injury.      Physical Exam   Triage Vital Signs: ED Triage Vitals  Enc Vitals Group     BP 05/01/22 0813 134/76     Pulse Rate 05/01/22 0813 (!) 56     Resp 05/01/22 0813 16     Temp 05/01/22 0813 98.2 F (36.8 C)     Temp Source 05/01/22 0813 Oral     SpO2 05/01/22 0813 100 %     Weight 05/01/22 0814 170 lb (77.1 kg)     Height 05/01/22 0814 5\' 6"  (1.676 m)     Head Circumference --      Peak Flow --      Pain Score 05/01/22 0813 0     Pain Loc --      Pain Edu? --      Excl. in [place]? --     Most recent vital signs: Vitals:   05/01/22 0813  BP: 134/76  Pulse: (!) 56  Resp: 16  Temp: 98.2 F (36.8 C)  SpO2: 100%     General: Awake, no distress.  CV:  Good peripheral perfusion.  Heart regular rate and rhythm. Resp:  Normal effort.  Clear bilaterally. Abd:  No distention.  Other:  PERRLA, EOMI's, cranial nerves II through XII grossly intact, normal speech.  Neck is supple without difficulty with range of motion.  There is noted a small nodule measuring approximately 1.5 cm to the left posterior scalp.  No source of infection is noted in this area.  Patient is ambulatory without any assistance.   ED Results / Procedures / Treatments   Labs (all labs ordered are listed, but only abnormal results are displayed) Labs Reviewed  POC URINE PREG, ED     RADIOLOGY CT scan per radiologist was negative for acute  intracranial changes.    PROCEDURES:  Critical Care performed:   Procedures   MEDICATIONS ORDERED IN ED: Medications  acetaminophen (TYLENOL) tablet 1,000 mg (1,000 mg Oral Given 05/01/22 0901)  ibuprofen (ADVIL) tablet 600 mg (600 mg Oral Given 05/01/22 1009)     IMPRESSION / MDM / ASSESSMENT AND PLAN / ED COURSE  I reviewed the triage vital signs and the nursing notes.   Differential diagnosis includes, but is not limited to, persistent generalized headache, tension headache, rule out tumor or lesion intracranially.  21 year old female presents to the ED with family with a history of a persistent headache for the 3 days.  Patient has not taken any over-the-counter medication for her headache.  It is not prevented her from doing activities, change to her vision, nausea, vomiting, or dizziness.  Patient was given Tylenol while in the ED along with ibuprofen which she tolerated well.  CT scan was negative for any acute intracranial changes.  Patient afterwards was feeling improved and asked for a note.  She was told to follow-up  with her PCP if any continued problems and she could continue taking Tylenol or ibuprofen as needed at home for her headache and to be sure and drink plenty of fluids to stay hydrated.  She is return to the emergency department if any severe worsening of her symptoms or urgent concerns.     FINAL CLINICAL IMPRESSION(S) / ED DIAGNOSES   Final diagnoses:  Acute nonintractable headache, unspecified headache type     Rx / DC Orders   ED Discharge Orders     None        Note:  This document was prepared using Dragon voice recognition software and may include unintentional dictation errors.   Johnn Hai, PA-C 05/01/22 1330    Vanessa Harlan, MD 05/01/22 1525

## 2023-02-09 ENCOUNTER — Ambulatory Visit (LOCAL_COMMUNITY_HEALTH_CENTER): Payer: Medicaid Other | Admitting: Family

## 2023-02-09 ENCOUNTER — Encounter: Payer: Self-pay | Admitting: Family

## 2023-02-09 ENCOUNTER — Ambulatory Visit: Payer: Medicaid Other

## 2023-02-09 VITALS — BP 115/68 | HR 68 | Ht 66.0 in | Wt 213.0 lb

## 2023-02-09 DIAGNOSIS — Z3046 Encounter for surveillance of implantable subdermal contraceptive: Secondary | ICD-10-CM

## 2023-02-09 DIAGNOSIS — Z01419 Encounter for gynecological examination (general) (routine) without abnormal findings: Secondary | ICD-10-CM

## 2023-02-09 DIAGNOSIS — Z30011 Encounter for initial prescription of contraceptive pills: Secondary | ICD-10-CM

## 2023-02-09 DIAGNOSIS — Z309 Encounter for contraceptive management, unspecified: Secondary | ICD-10-CM

## 2023-02-09 MED ORDER — LEVONORGESTREL-ETHINYL ESTRAD 0.1-20 MG-MCG PO TABS: 1.0000 | ORAL_TABLET | Freq: Every day | ORAL | 12 refills | Status: AC

## 2023-02-09 NOTE — Progress Notes (Signed)
[facility] [place] Number: [phone]  Family Planning Visit- Repeat Yearly Visit  Subjective:  Erica Finley is a 22 y.o. G0P0000  being seen today for an annual wellness visit and to discuss contraception options.   The patient is currently using Hormonal Implant for pregnancy prevention. Patient does not want a pregnancy in the next year.    report they are looking for a method that provides Minimal bleeding/improved bleeding profile   Patient has the following medical problems: has Obesity, 233 lbs and Overweight BMI=37.6 on their problem list.  Chief Complaint  Patient presents with   Contraception    Physical and nexplanon removal patient wants OCPs     Patient reports heavy irregular bleeding for the last 5-6 months, with only temporary relief when prescribed Tri-Sprintec. Nexplanon inserted 01/25/2020, would like Nexplanon removal even though she understands that it is effective for one more year. Prefers to take birth control pills and agrees to take them at the same time everyday.   See flowsheet for other program required questions.   Body mass index is 34.38 kg/m. - Patient is eligible for diabetes screening based on BMI> 25 and age >35?  no HA1C ordered? no  Patient reports 1 of partners in last year. Desires STI screening?  No   Has patient been screened once for HCV in the past?  No  No results found for: "HCVAB"  Does the patient have current of drug use, have a partner with drug use, and/or has been incarcerated since last result? No  If yes-- Screen for HCV through [facility]   Does the patient meet criteria for HBV testing? No  Criteria:  -Household, sexual or needle sharing contact with HBV -History of drug use -HIV positive -Those with known Hep C   Health Maintenance Due  Topic Date Due   COVID-19 Vaccine (1) Never done   HPV VACCINES (1 - 2-dose series) Never done    Hepatitis C Screening  Never done   DTaP/Tdap/Td (1 - Tdap) Never done   INFLUENZA VACCINE  Never done   CHLAMYDIA SCREENING  08/12/2022    Review of Systems  All other systems reviewed and are negative.   The following portions of the patient's history were reviewed and updated as appropriate: allergies, current medications, past family history, past medical history, past social history, past surgical history and problem list. Problem list updated.  Objective:   Vitals:   02/09/23 0859  BP: 115/68  Pulse: 68  Weight: 213 lb (96.6 kg)  Height: '5\' 6"'$  (1.676 m)    Physical Exam Vitals and nursing note reviewed.  Constitutional:      Appearance: Normal appearance.  Cardiovascular:     Rate and Rhythm: Normal rate and regular rhythm.     Pulses: Normal pulses.     Heart sounds: Normal heart sounds.  Pulmonary:     Effort: Pulmonary effort is normal.     Breath sounds: Normal breath sounds.  Skin:    General: Skin is warm and dry.  Neurological:     Mental Status: She is alert and oriented to person, place, and time.     Assessment and Plan:  Erica Finley is a 22 y.o. female [place] presenting to the [facility] Department for an yearly wellness and contraception visit   Contraception counseling: Reviewed options based on patient desire and reproductive life plan. Patient is interested in Oral Contraceptive. This  was provided to the patient today.   Risks, benefits, and typical effectiveness rates were reviewed.  Questions were answered.  Written information was also given to the patient to review.    The patient will follow up in  1 years for surveillance.  The patient was told to call with any further questions, or with any concerns about this method of contraception.  Emphasized use of condoms 100% of the time for STI prevention.  Patient was assessed for need for ECP, not indicated.  1. Well woman exam Physical Exam performed per ACOG  guidelines CBE due at age 44 Declined STI testing today Discussed weight loss through diet and exercise  2. Encounter for initial prescription of contraceptive pills Sronyx 1 PO qd, #28, 12 refills Rx sent to her preferred pharmacy Start pills today Use condoms for next 7 days as backup contraception, then always thereafter for STI prevention  3. Encounter for Nexplanon removal Nexplanon Removal Patient identified, informed consent performed, consent signed.   Appropriate time out taken. Nexplanon site identified on left arm.  Area prepped in usual sterile fashon. 3 ml of 1% lidocaine with Epinephrine was used to anesthetize the area at the distal end of the implant and along implant site. A small stab incision was made right beside the implant on the distal portion.  The Nexplanon rod was grasped using hemostats and removed without difficulty.  There was minimal blood loss. There were no complications.  Steri-strips were applied over the small incision.  A pressure bandage was applied to reduce any bruising.  The patient tolerated the procedure well and was given post procedure instructions.     Nexplanon:   Counseled patient to take OTC analgesic starting as soon as lidocaine starts to wear off and take regularly for at least 48 hr to decrease discomfort.  Specifically to take with food or milk to decrease stomach upset and for IB 600 mg (3 tablets) every 6 hrs; IB 800 mg (4 tablets) every 8 hrs; or Aleve 2 tablets every 12 hrs.       Return in about 1 year (around 02/09/2024) for Yearly physical.  No future appointments.  Marline Backbone, FNP

## 2023-02-09 NOTE — Progress Notes (Signed)
Pt here for physical exam, Nexplanon removal and start OCPs.  No inhouse labs performed.  Nexplanon removed by K. House, FNP without difficulty.  Rx for OCPs sent to pt's pharmacy of choice.  Pt advised of Rx.  Verbalizes understanding.  Condoms encouraged as backup method x 7 days. Condoms declined.-Forest Becker, RN

## 2023-05-10 ENCOUNTER — Other Ambulatory Visit: Payer: Self-pay

## 2023-05-10 ENCOUNTER — Emergency Department
Admission: EM | Admit: 2023-05-10 | Discharge: 2023-05-10 | Discharge status: Against Medical Advice | Payer: Medicaid Other | Attending: Emergency Medicine | Admitting: Emergency Medicine

## 2023-05-10 DIAGNOSIS — H9202 Otalgia, left ear: Secondary | ICD-10-CM | POA: Insufficient documentation

## 2023-05-10 DIAGNOSIS — Z5321 Procedure and treatment not carried out due to patient leaving prior to being seen by health care provider: Secondary | ICD-10-CM | POA: Insufficient documentation

## 2023-05-10 NOTE — ED Notes (Signed)
Called x1

## 2023-05-10 NOTE — ED Triage Notes (Signed)
Pt reports has a bump in her left ear that has been there about a month and now it is draining pus and blood.

## 2023-05-29 IMAGING — CR DG HAND COMPLETE 3+V*R*
3 series · 3 of 3 positions shown · non-contrast
Comparison: None.

CLINICAL DATA: Hand pain after punching a wall

EXAM:
RIGHT HAND - COMPLETE 3+ VIEW

[hand ap]
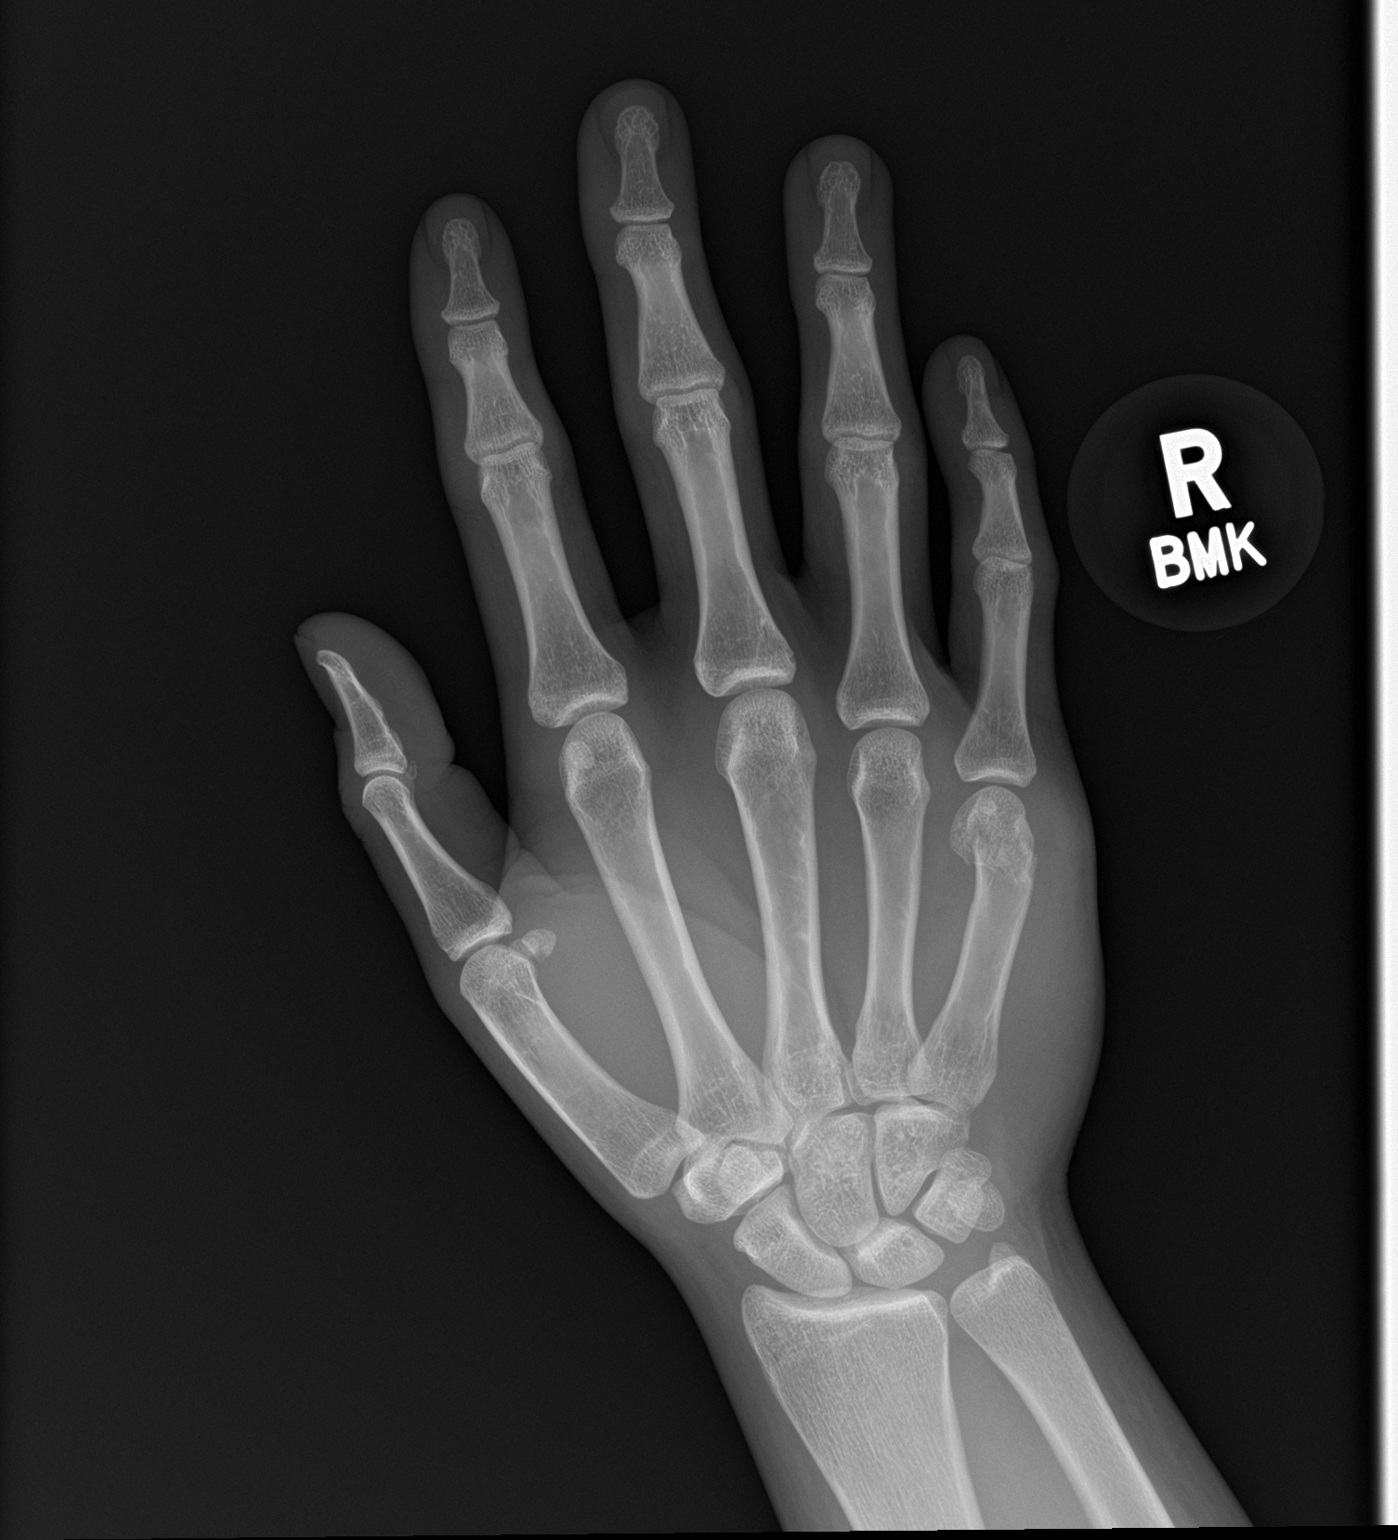

[hand obl]
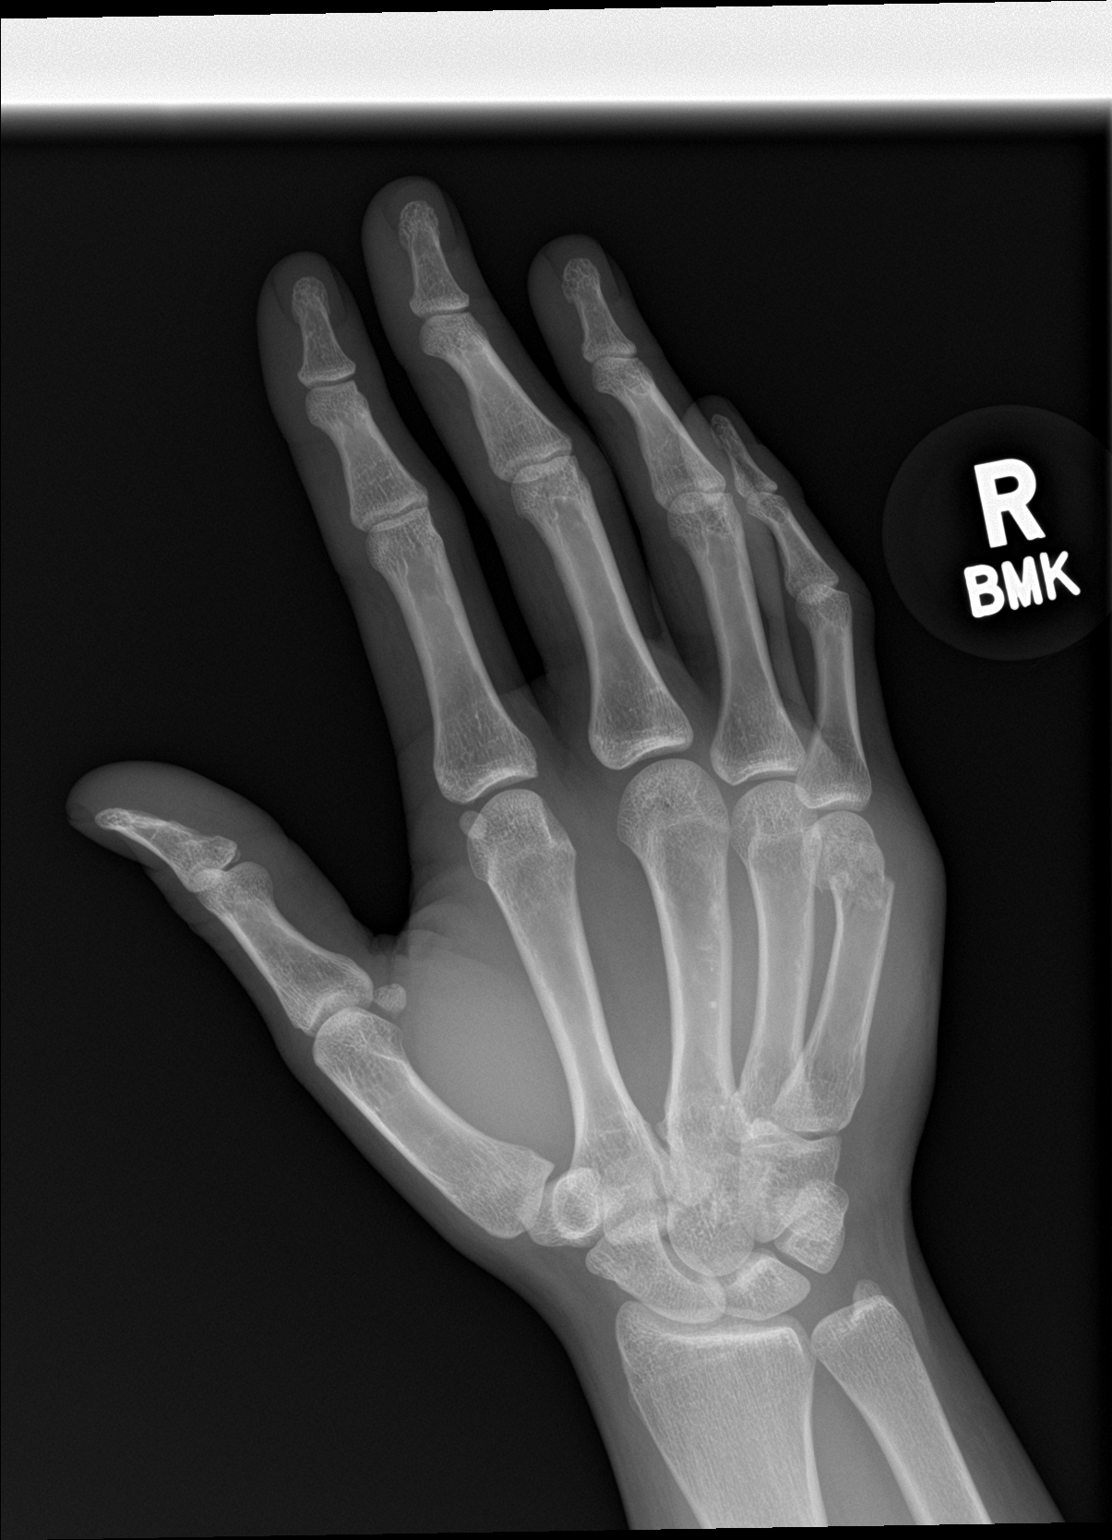

[hand lat]
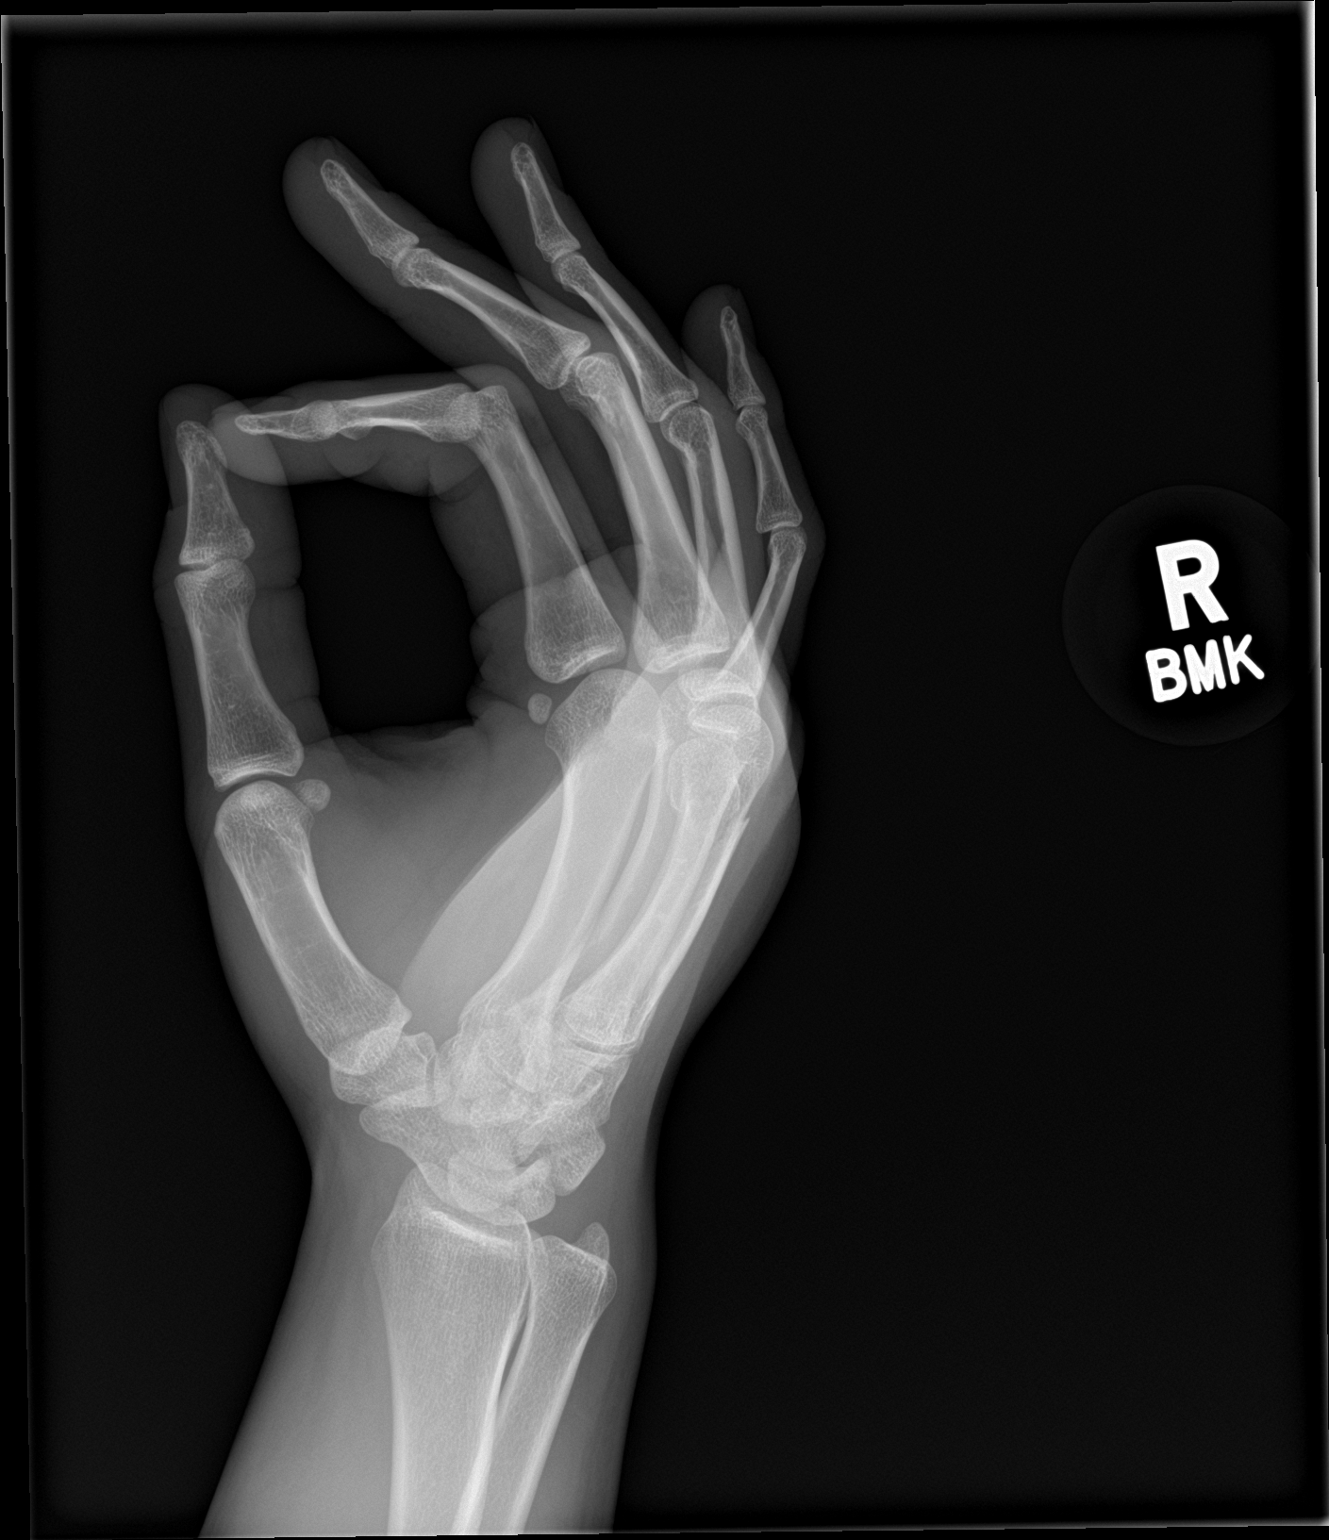

[3 of 3 positions shown; findings below may reference images not displayed]

FINDINGS: There is a minimally displaced fracture of the distal shaft of the
right fifth metacarpal.
IMPRESSION: Minimally displaced fracture of the distal shaft of the right fifth
metacarpal.

## 2023-10-24 IMAGING — CT CT HEAD W/O CM
4 series · 17 of 47 positions shown, 19 images · non-contrast
Comparison: None Available.

CLINICAL DATA: Headaches



[Series 2: head bone · axial · 0.43mm/px · z∈[-249,-197]mm · 4 of 75 slices shown]
[im 8/75  bone]
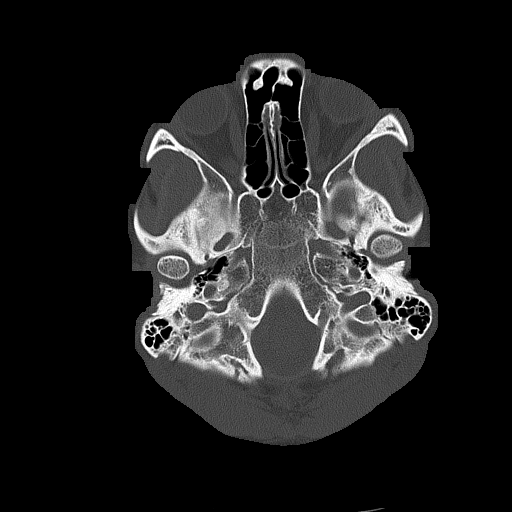
[im 15/75  bone]
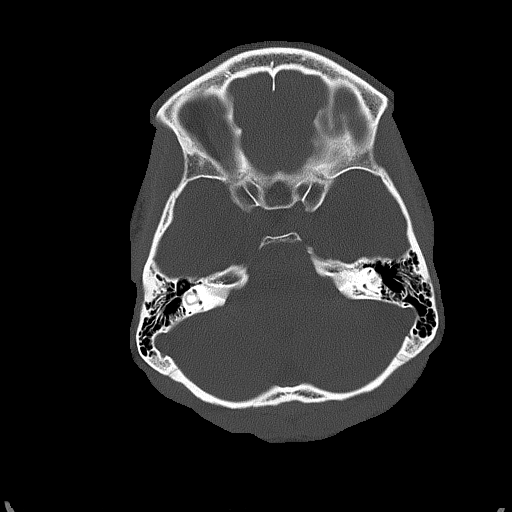
[im 23/75  bone]
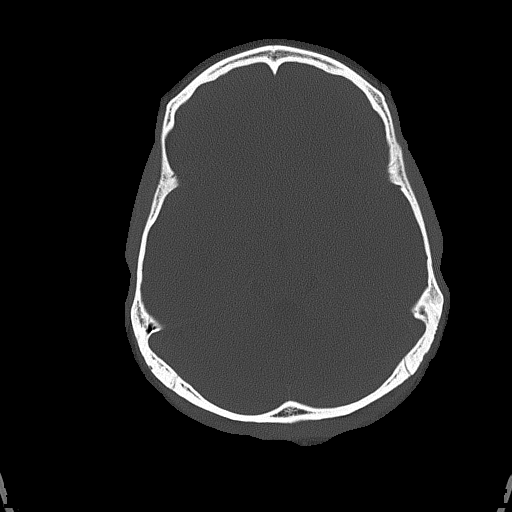
[im 34/75  bone]
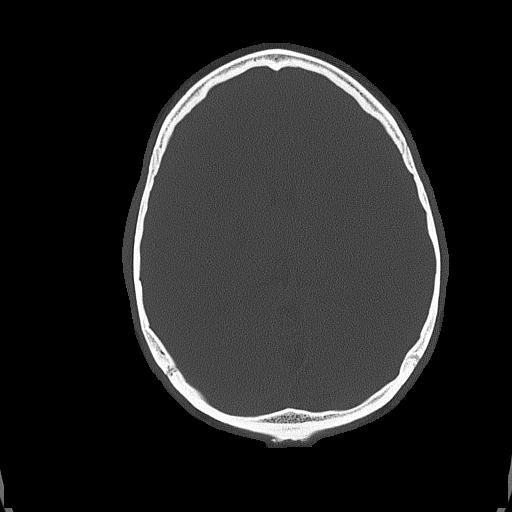

[Series 3: head wo · axial · 0.43mm/px · z∈[-248,-138]mm · 7 of 30 slices shown, 9 images]
[im 4/30  brain]
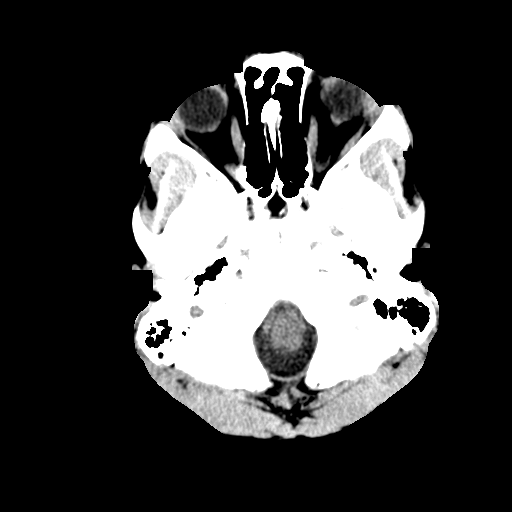
[im 4/30  bone]
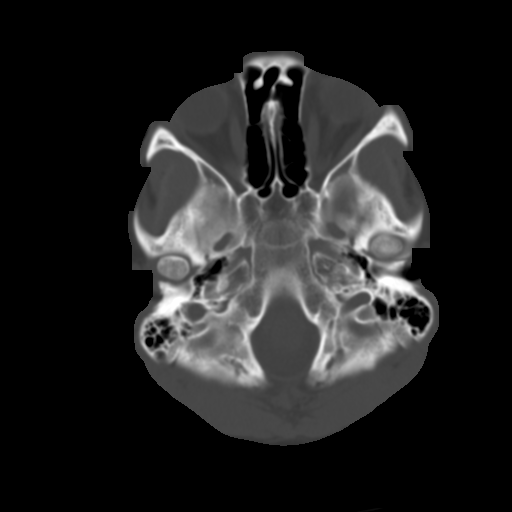
[im 8/30  brain]
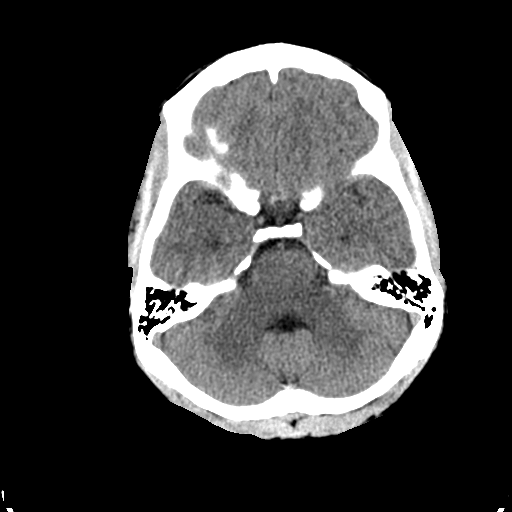
[im 11/30  brain]
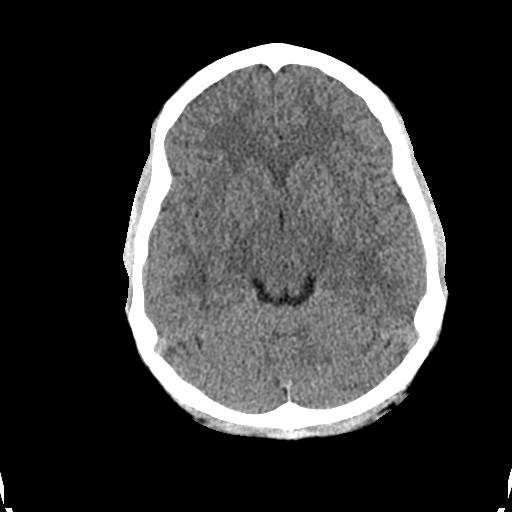
[im 15/30  brain]
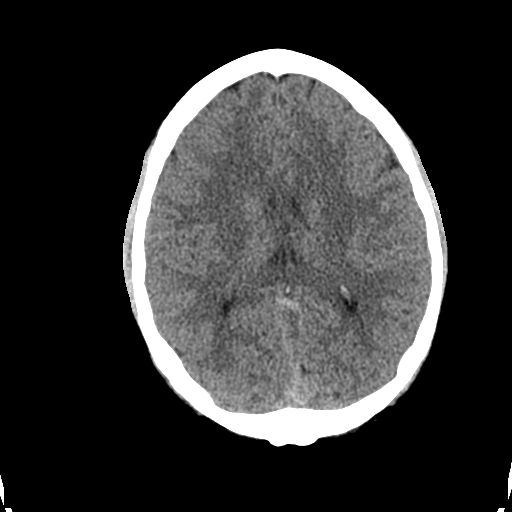
[im 19/30  brain]
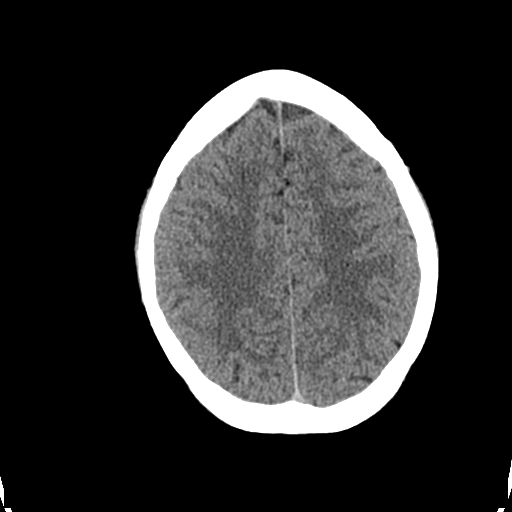
[im 19/30  bone]
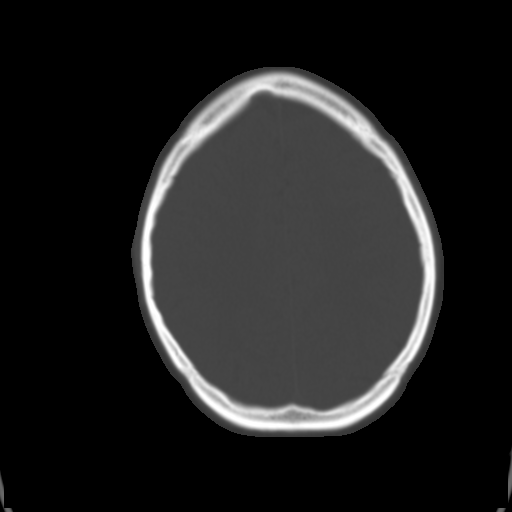
[im 22/30  brain]
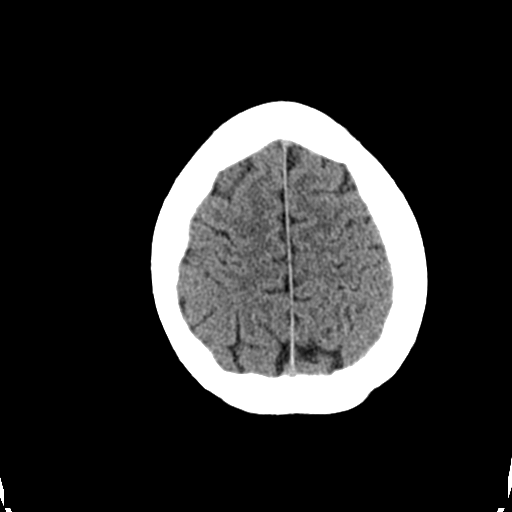
[im 26/30  brain]
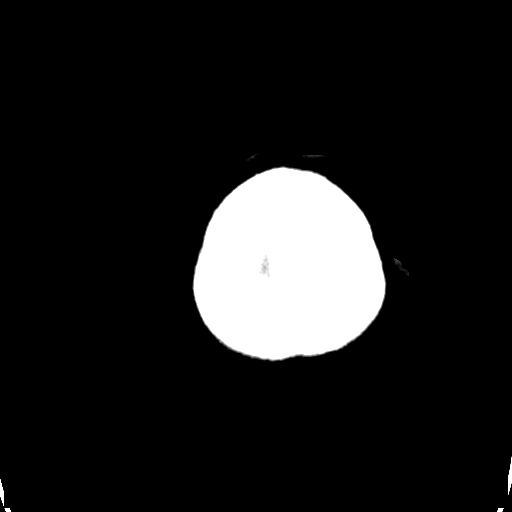

[Series 4: coronal soft tissue · coronal · 0.32mm/px · 3 of 65 slices shown]
[im 22/65  brain]
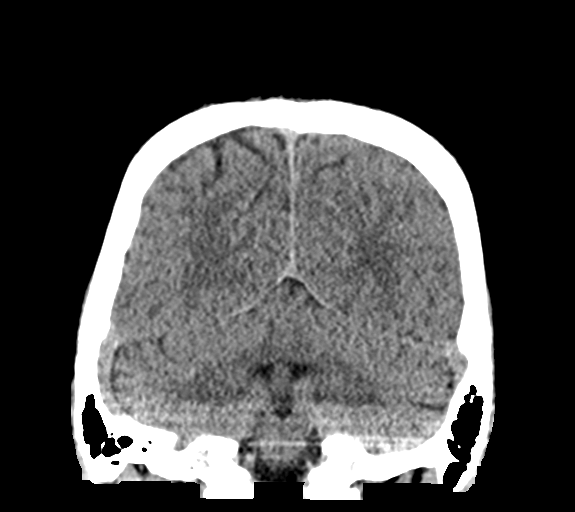
[im 29/65  brain]
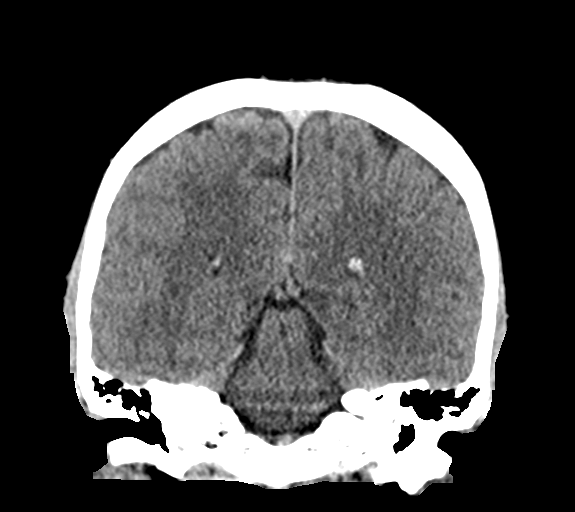
[im 36/65  brain]
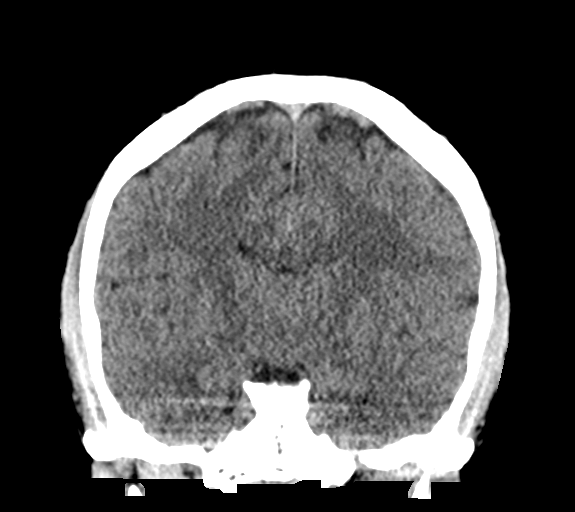

[Series 5: sagittal soft tissue · sagittal · 0.32mm/px · 3 of 62 slices shown]
[im 21/62  brain]
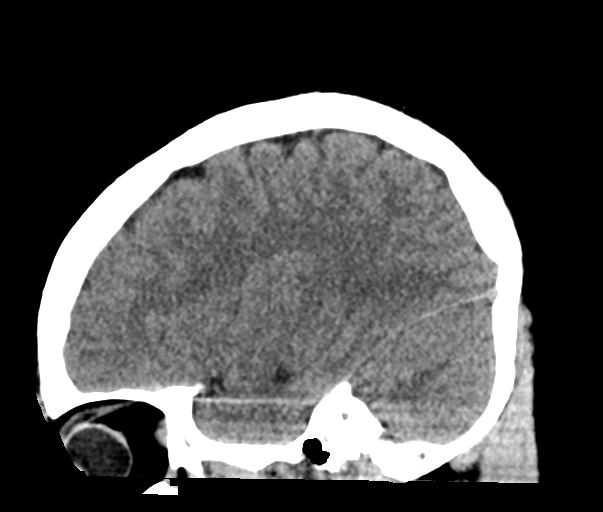
[im 31/62  brain]
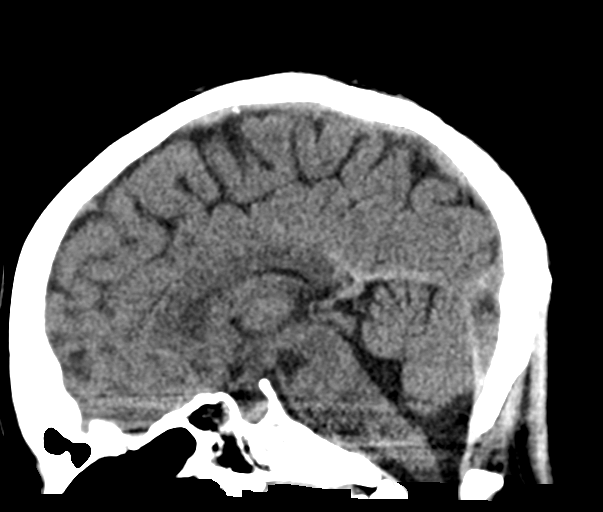
[im 41/62  brain]
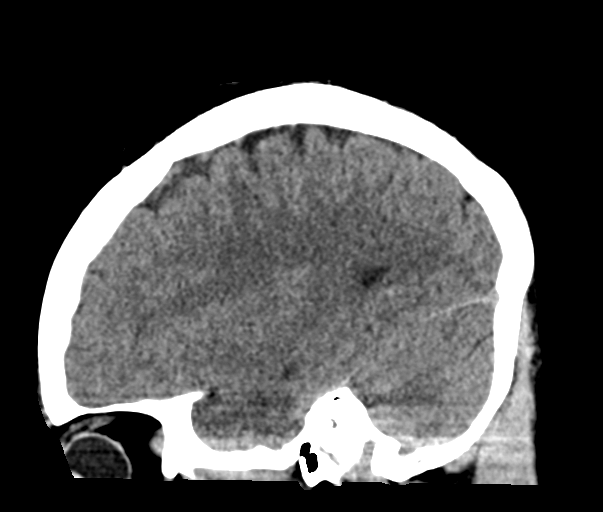

[17 of 47 positions shown; findings below may reference images not displayed]

FINDINGS: Brain: No acute intracranial findings are seen. There are no signs
of bleeding within the cranium. Ventricles are not dilated. There is
no focal edema or mass effect.

Vascular: Unremarkable.

Skull: Unremarkable.

Sinuses/Orbits: Unremarkable.

Other: None.
IMPRESSION: No acute intracranial findings are seen in noncontrast CT brain.
